# Patient Record
Sex: Female | Born: 1940 | Race: White | Hispanic: No | Marital: Married | Smoking: Former smoker
Health system: Southern US, Community
[De-identification: ages and names within clinical notes are randomized; demographics above are authoritative.]

## PROBLEM LIST (undated history)

## (undated) DIAGNOSIS — I10 Essential (primary) hypertension: Secondary | ICD-10-CM

## (undated) DIAGNOSIS — C801 Malignant (primary) neoplasm, unspecified: Secondary | ICD-10-CM

## (undated) DIAGNOSIS — M1990 Unspecified osteoarthritis, unspecified site: Secondary | ICD-10-CM

## (undated) HISTORY — PX: BRAIN SURGERY: SHX531

## (undated) HISTORY — PX: SHOULDER SURGERY: SHX246

## (undated) HISTORY — PX: APPENDECTOMY: SHX54

---

## 2008-02-18 HISTORY — PX: BACK SURGERY: SHX140

## 2010-02-17 HISTORY — PX: BLADDER SURGERY: SHX569

## 2012-02-18 HISTORY — PX: HEMATOMA EVACUATION: SHX5118

## 2013-06-01 ENCOUNTER — Other Ambulatory Visit: Payer: Self-pay | Admitting: Neurosurgery

## 2013-06-01 ENCOUNTER — Encounter (HOSPITAL_COMMUNITY): Payer: Self-pay | Admitting: Pharmacy Technician

## 2013-06-07 NOTE — Pre-Procedure Instructions (Addendum)
Sheila Rodgers  06/07/2013   Your procedure is scheduled on: 06-13-2013  Monday     Report to [facility]  2 * 3 at 5:30 AM.   Call this number if you have problems the morning of surgery: [phone]   Remember:   Do not eat food or drink liquids after midnight.    Take these medicines the morning of surgery with A SIP OF WATER: pain medication if needed                Take all meds as ordered  Until day of surgery except as instructed below or per dr         Sheila Rodgers all herbel meds, nsaids (aleve,naproxen,advil,ibuprofen) now including vitamins, aspirin, glucosamine                                                                                                    Do not wear jewelry, make-up or nail polish.  Do not wear lotions, powders, or perfumes.   Do not shave 48 hours prior to surgery.   Do not bring valuables to the hospital.  [facility] is not responsible for any belongings or valuables.               Contacts, dentures or bridgework may not be worn into surgery.   Leave suitcase in the car. After surgery it may be brought to your room.   For patients admitted to the hospital, discharge time is determined by your  treatment team.               Patients discharged the day of surgery will not be allowed to drive home.     Special Instructions:  Special Instructions: [facility] - Preparing for Surgery  Before surgery, you can play an important role.  Because skin is not sterile, your skin needs to be as free of germs as possible.  You can reduce the number of germs on you skin by washing with CHG (chlorahexidine gluconate) soap before surgery.  CHG is an antiseptic cleaner which kills germs and bonds with the skin to continue killing germs even after washing.  Please DO NOT use if you have an allergy to CHG or antibacterial soaps.  If your skin becomes reddened/irritated stop using the CHG and inform your nurse when you arrive at [facility].  Do not shave (including legs and underarms) for at least 48 hours prior to the first CHG shower.  You may shave your face.  Please follow these instructions carefully:   1.  Shower with CHG Soap the night before surgery and the morning of Surgery.  2.  If you choose to wash your hair, wash your hair first as usual with your normal shampoo.  3.  After you shampoo, rinse your hair and body thoroughly to remove the Shampoo.  4.  Use CHG as you would any other liquid soap.  You can apply chg directly  to the skin and wash gently with scrungie or a clean washcloth.  5.  Apply the CHG  Soap to your body ONLY FROM THE NECK DOWN.  Do not use on open wounds or open sores.  Avoid contact with your eyes ears, mouth and genitals (private parts).  Wash genitals (private parts)       with your normal soap.  6.  Wash thoroughly, paying special attention to the area where your surgery will be performed.  7.  Thoroughly rinse your body with warm water from the neck down.  8.  DO NOT shower/wash with your normal soap after using and rinsing off the CHG Soap.  9.  Pat yourself dry with a clean towel.            10.  Wear clean pajamas.            11.  Place clean sheets on your bed the night of your first shower and do not sleep with pets.  Day of Surgery  Do not apply any lotions/deodorants the morning of surgery.  Please wear clean clothes to the hospital/surgery center.  Please read over the following fact sheets that you were given: Pain Booklet, Coughing and Deep Breathing and Surgical Site Infection Prevention

## 2013-06-08 ENCOUNTER — Encounter (HOSPITAL_COMMUNITY): Payer: Self-pay

## 2013-06-08 ENCOUNTER — Encounter (HOSPITAL_COMMUNITY)
Admission: RE | Admit: 2013-06-08 | Discharge: 2013-06-08 | Disposition: A | Discharge status: Home / Self Care | Payer: Medicare Other | Source: Ambulatory Visit | Attending: Anesthesiology | Admitting: Anesthesiology

## 2013-06-08 ENCOUNTER — Encounter (HOSPITAL_COMMUNITY)
Admission: RE | Admit: 2013-06-08 | Discharge: 2013-06-08 | Disposition: A | Discharge status: Home / Self Care | Payer: Medicare Other | Source: Ambulatory Visit | Attending: Neurosurgery | Admitting: Neurosurgery

## 2013-06-08 DIAGNOSIS — Z01812 Encounter for preprocedural laboratory examination: Secondary | ICD-10-CM | POA: Insufficient documentation

## 2013-06-08 DIAGNOSIS — Z01810 Encounter for preprocedural cardiovascular examination: Secondary | ICD-10-CM | POA: Insufficient documentation

## 2013-06-08 DIAGNOSIS — Z01818 Encounter for other preprocedural examination: Secondary | ICD-10-CM | POA: Insufficient documentation

## 2013-06-08 HISTORY — DX: Unspecified osteoarthritis, unspecified site: M19.90

## 2013-06-08 HISTORY — DX: Malignant (primary) neoplasm, unspecified: C80.1

## 2013-06-08 HISTORY — DX: Essential (primary) hypertension: I10

## 2013-06-08 LAB — CBC WITH DIFFERENTIAL/PLATELET
BASOS ABS: 0 10*3/uL (ref 0.0–0.1)
Basophils Relative: 0 % (ref 0–1)
EOS ABS: 0.2 10*3/uL (ref 0.0–0.7)
EOS PCT: 4 % (ref 0–5)
HCT: 36 % (ref 36.0–46.0)
Hemoglobin: 12.1 g/dL (ref 12.0–15.0)
LYMPHS ABS: 1.1 10*3/uL (ref 0.7–4.0)
Lymphocytes Relative: 25 % (ref 12–46)
MCH: 32.1 pg (ref 26.0–34.0)
MCHC: 33.6 g/dL (ref 30.0–36.0)
MCV: 95.5 fL (ref 78.0–100.0)
Monocytes Absolute: 0.4 10*3/uL (ref 0.1–1.0)
Monocytes Relative: 8 % (ref 3–12)
Neutro Abs: 2.8 10*3/uL (ref 1.7–7.7)
Neutrophils Relative %: 63 % (ref 43–77)
Platelets: 147 10*3/uL — ABNORMAL LOW (ref 150–400)
RBC: 3.77 MIL/uL — ABNORMAL LOW (ref 3.87–5.11)
RDW: 13.2 % (ref 11.5–15.5)
WBC: 4.5 10*3/uL (ref 4.0–10.5)

## 2013-06-08 LAB — BASIC METABOLIC PANEL
BUN: 21 mg/dL (ref 6–23)
CO2: 25 meq/L (ref 19–32)
CREATININE: 0.81 mg/dL (ref 0.50–1.10)
Calcium: 9 mg/dL (ref 8.4–10.5)
Chloride: 104 mEq/L (ref 96–112)
GFR calc Af Amer: 81 mL/min — ABNORMAL LOW (ref >90)
GFR calc non Af Amer: 70 mL/min — ABNORMAL LOW (ref >90)
Glucose, Bld: 82 mg/dL (ref 70–99)
Potassium: 5 mEq/L (ref 3.7–5.3)
Sodium: 141 mEq/L (ref 137–147)

## 2013-06-08 LAB — SURGICAL PCR SCREEN
MRSA, PCR: NEGATIVE
Staphylococcus aureus: POSITIVE — AB

## 2013-06-08 NOTE — Progress Notes (Signed)
Pt called stating that she had "failed" the nose swab test and that the nurse who called her had told her the rx had been called into [facility]. She states she had called them several times and they have not received the order for the Mupirocin. I called and spoke with the pharmacist and they stated they did not have the order. I gave them the order for the Mupirocin and then called the pt back and told her to check with [facility] in an hour to see if it is ready.

## 2013-06-12 MED ORDER — CEFAZOLIN SODIUM-DEXTROSE 2-3 GM-% IV SOLR
2.0000 g | INTRAVENOUS | Status: AC
  Administered 2013-06-13: 2 g via INTRAVENOUS
  Filled 2013-06-12: qty 50

## 2013-06-13 ENCOUNTER — Inpatient Hospital Stay (HOSPITAL_COMMUNITY): Payer: Medicare Other | Admitting: Anesthesiology

## 2013-06-13 ENCOUNTER — Encounter (HOSPITAL_COMMUNITY): Payer: Medicare Other | Admitting: Anesthesiology

## 2013-06-13 ENCOUNTER — Inpatient Hospital Stay (HOSPITAL_COMMUNITY): Payer: Medicare Other

## 2013-06-13 ENCOUNTER — Encounter (HOSPITAL_COMMUNITY): Payer: Self-pay | Admitting: *Deleted

## 2013-06-13 ENCOUNTER — Encounter (HOSPITAL_COMMUNITY)
Admission: RE | Disposition: A | Discharge status: Home / Self Care | Payer: Self-pay | Source: Ambulatory Visit | Attending: Neurosurgery

## 2013-06-13 ENCOUNTER — Inpatient Hospital Stay (HOSPITAL_COMMUNITY)
Admission: RE | Admit: 2013-06-13 | Discharge: 2013-06-14 | DRG: 473 | Disposition: A | Discharge status: Home / Self Care | Payer: Medicare Other | Source: Ambulatory Visit | Attending: Neurosurgery | Admitting: Neurosurgery

## 2013-06-13 DIAGNOSIS — Z87891 Personal history of nicotine dependence: Secondary | ICD-10-CM

## 2013-06-13 DIAGNOSIS — I10 Essential (primary) hypertension: Secondary | ICD-10-CM | POA: Diagnosis present

## 2013-06-13 DIAGNOSIS — M4712 Other spondylosis with myelopathy, cervical region: Principal | ICD-10-CM | POA: Diagnosis present

## 2013-06-13 DIAGNOSIS — Z8551 Personal history of malignant neoplasm of bladder: Secondary | ICD-10-CM

## 2013-06-13 DIAGNOSIS — M129 Arthropathy, unspecified: Secondary | ICD-10-CM | POA: Diagnosis present

## 2013-06-13 HISTORY — PX: ANTERIOR CERVICAL DECOMPRESSION/DISCECTOMY FUSION 4 LEVELS: SHX5556

## 2013-06-13 SURGERY — ANTERIOR CERVICAL DECOMPRESSION/DISCECTOMY FUSION 4 LEVELS
Anesthesia: General

## 2013-06-13 MED ORDER — HYDROCODONE-ACETAMINOPHEN 5-325 MG PO TABS
1.0000 | ORAL_TABLET | ORAL | Status: DC | PRN
  Administered 2013-06-13 – 2013-06-14 (×2): 2 via ORAL
  Filled 2013-06-13 (×2): qty 2

## 2013-06-13 MED ORDER — ACETAMINOPHEN 650 MG RE SUPP: 650.0000 mg | RECTAL | Status: DC | PRN

## 2013-06-13 MED ORDER — OXYCODONE-ACETAMINOPHEN 5-325 MG PO TABS: 1.0000 | ORAL_TABLET | ORAL | Status: DC | PRN

## 2013-06-13 MED ORDER — CEFAZOLIN SODIUM 1-5 GM-% IV SOLN
1.0000 g | Freq: Three times a day (TID) | INTRAVENOUS | Status: AC
  Administered 2013-06-13 (×2): 1 g via INTRAVENOUS
  Filled 2013-06-13 (×2): qty 50

## 2013-06-13 MED ORDER — SODIUM CHLORIDE 0.9 % IV SOLN
250.0000 mL | INTRAVENOUS | Status: DC
Start: 2013-06-13 — End: 2013-06-14

## 2013-06-13 MED ORDER — SIMVASTATIN 20 MG PO TABS
20.0000 mg | ORAL_TABLET | Freq: Every day | ORAL | Status: DC
  Administered 2013-06-13 – 2013-06-14 (×2): 20 mg via ORAL
  Filled 2013-06-13 (×2): qty 1

## 2013-06-13 MED ORDER — MENTHOL 3 MG MT LOZG
1.0000 | LOZENGE | OROMUCOSAL | Status: DC | PRN
  Filled 2013-06-13: qty 9

## 2013-06-13 MED ORDER — SODIUM CHLORIDE 0.9 % IR SOLN
Status: DC | PRN
  Administered 2013-06-13: 08:00:00

## 2013-06-13 MED ORDER — FENTANYL CITRATE 0.05 MG/ML IJ SOLN
INTRAMUSCULAR | Status: DC | PRN
  Administered 2013-06-13 (×3): 50 ug via INTRAVENOUS
  Administered 2013-06-13: 100 ug via INTRAVENOUS

## 2013-06-13 MED ORDER — HYDROMORPHONE HCL PF 1 MG/ML IJ SOLN
INTRAMUSCULAR | Status: AC
  Filled 2013-06-13: qty 1

## 2013-06-13 MED ORDER — LIDOCAINE HCL (CARDIAC) 20 MG/ML IV SOLN
INTRAVENOUS | Status: DC | PRN
  Administered 2013-06-13: 40 mg via INTRAVENOUS

## 2013-06-13 MED ORDER — PHENYLEPHRINE HCL 10 MG/ML IJ SOLN
INTRAMUSCULAR | Status: DC | PRN
  Administered 2013-06-13: 120 ug via INTRAVENOUS

## 2013-06-13 MED ORDER — LACTATED RINGERS IV SOLN
INTRAVENOUS | Status: DC | PRN
  Administered 2013-06-13: 07:00:00 via INTRAVENOUS

## 2013-06-13 MED ORDER — ROCURONIUM BROMIDE 100 MG/10ML IV SOLN
INTRAVENOUS | Status: DC | PRN
  Administered 2013-06-13: 50 mg via INTRAVENOUS
  Administered 2013-06-13: 10 mg via INTRAVENOUS

## 2013-06-13 MED ORDER — 0.9 % SODIUM CHLORIDE (POUR BTL) OPTIME
TOPICAL | Status: DC | PRN
  Administered 2013-06-13: 1000 mL

## 2013-06-13 MED ORDER — NEOSTIGMINE METHYLSULFATE 1 MG/ML IJ SOLN
INTRAMUSCULAR | Status: DC | PRN
  Administered 2013-06-13: 2.5 mg via INTRAVENOUS

## 2013-06-13 MED ORDER — OXYCODONE-ACETAMINOPHEN 10-325 MG PO TABS: 1.0000 | ORAL_TABLET | ORAL | Status: DC | PRN

## 2013-06-13 MED ORDER — OXYCODONE HCL 5 MG PO TABS
5.0000 mg | ORAL_TABLET | Freq: Once | ORAL | Status: AC | PRN
  Administered 2013-06-13: 5 mg via ORAL

## 2013-06-13 MED ORDER — OXYCODONE HCL 5 MG/5ML PO SOLN: 5.0000 mg | Freq: Once | ORAL | Status: AC | PRN

## 2013-06-13 MED ORDER — PHENYLEPHRINE 40 MCG/ML (10ML) SYRINGE FOR IV PUSH (FOR BLOOD PRESSURE SUPPORT)
PREFILLED_SYRINGE | INTRAVENOUS | Status: AC
  Filled 2013-06-13: qty 10

## 2013-06-13 MED ORDER — METOCLOPRAMIDE HCL 5 MG/ML IJ SOLN
INTRAMUSCULAR | Status: DC | PRN
  Administered 2013-06-13: 10 mg via INTRAVENOUS

## 2013-06-13 MED ORDER — ALBUMIN HUMAN 5 % IV SOLN
INTRAVENOUS | Status: DC | PRN
  Administered 2013-06-13: 10:00:00 via INTRAVENOUS

## 2013-06-13 MED ORDER — CYCLOBENZAPRINE HCL 10 MG PO TABS
10.0000 mg | ORAL_TABLET | Freq: Three times a day (TID) | ORAL | Status: DC | PRN
  Administered 2013-06-13 – 2013-06-14 (×3): 10 mg via ORAL
  Filled 2013-06-13 (×2): qty 1

## 2013-06-13 MED ORDER — SODIUM CHLORIDE 0.9 % IJ SOLN: 3.0000 mL | INTRAMUSCULAR | Status: DC | PRN

## 2013-06-13 MED ORDER — SODIUM CHLORIDE 0.9 % IV SOLN
10.0000 mg | INTRAVENOUS | Status: DC | PRN
  Administered 2013-06-13: 10 ug/min via INTRAVENOUS

## 2013-06-13 MED ORDER — OXYCODONE HCL 5 MG PO TABS: 5.0000 mg | ORAL_TABLET | ORAL | Status: DC | PRN

## 2013-06-13 MED ORDER — OXYCODONE HCL 5 MG PO TABS
5.0000 mg | ORAL_TABLET | ORAL | Status: DC | PRN
  Administered 2013-06-13 – 2013-06-14 (×4): 5 mg via ORAL
  Filled 2013-06-13 (×4): qty 1

## 2013-06-13 MED ORDER — ACETAMINOPHEN 325 MG PO TABS: 650.0000 mg | ORAL_TABLET | ORAL | Status: DC | PRN

## 2013-06-13 MED ORDER — LIDOCAINE HCL (CARDIAC) 20 MG/ML IV SOLN
INTRAVENOUS | Status: AC
  Filled 2013-06-13: qty 5

## 2013-06-13 MED ORDER — LISINOPRIL 10 MG PO TABS
10.0000 mg | ORAL_TABLET | Freq: Every day | ORAL | Status: DC
  Administered 2013-06-13 – 2013-06-14 (×2): 10 mg via ORAL
  Filled 2013-06-13 (×2): qty 1

## 2013-06-13 MED ORDER — OXYCODONE HCL 5 MG PO TABS
ORAL_TABLET | ORAL | Status: AC
  Filled 2013-06-13: qty 1

## 2013-06-13 MED ORDER — SENNA 8.6 MG PO TABS
1.0000 | ORAL_TABLET | Freq: Two times a day (BID) | ORAL | Status: DC
  Administered 2013-06-13 – 2013-06-14 (×3): 8.6 mg via ORAL
  Filled 2013-06-13 (×4): qty 1

## 2013-06-13 MED ORDER — THROMBIN 5000 UNITS EX SOLR
OROMUCOSAL | Status: DC | PRN
  Administered 2013-06-13: 10:00:00 via TOPICAL

## 2013-06-13 MED ORDER — PROPOFOL 10 MG/ML IV BOLUS
INTRAVENOUS | Status: DC | PRN
  Administered 2013-06-13: 150 mg via INTRAVENOUS

## 2013-06-13 MED ORDER — OXYCODONE-ACETAMINOPHEN 5-325 MG PO TABS
1.0000 | ORAL_TABLET | ORAL | Status: DC | PRN
  Administered 2013-06-13 – 2013-06-14 (×4): 1 via ORAL
  Filled 2013-06-13 (×4): qty 1

## 2013-06-13 MED ORDER — FENTANYL CITRATE 0.05 MG/ML IJ SOLN
INTRAMUSCULAR | Status: AC
  Filled 2013-06-13: qty 5

## 2013-06-13 MED ORDER — SODIUM CHLORIDE 0.9 % IJ SOLN
INTRAMUSCULAR | Status: AC
  Filled 2013-06-13: qty 10

## 2013-06-13 MED ORDER — ROCURONIUM BROMIDE 50 MG/5ML IV SOLN
INTRAVENOUS | Status: AC
  Filled 2013-06-13: qty 1

## 2013-06-13 MED ORDER — GLYCOPYRROLATE 0.2 MG/ML IJ SOLN
INTRAMUSCULAR | Status: DC | PRN
  Administered 2013-06-13: .5 mg via INTRAVENOUS

## 2013-06-13 MED ORDER — ALUM & MAG HYDROXIDE-SIMETH 200-200-20 MG/5ML PO SUSP: 30.0000 mL | Freq: Four times a day (QID) | ORAL | Status: DC | PRN

## 2013-06-13 MED ORDER — HYDROMORPHONE HCL PF 1 MG/ML IJ SOLN
0.5000 mg | INTRAMUSCULAR | Status: DC | PRN
  Administered 2013-06-13 – 2013-06-14 (×3): 1 mg via INTRAVENOUS
  Filled 2013-06-13 (×3): qty 1

## 2013-06-13 MED ORDER — EPHEDRINE SULFATE 50 MG/ML IJ SOLN
INTRAMUSCULAR | Status: AC
  Filled 2013-06-13: qty 1

## 2013-06-13 MED ORDER — GELATIN ABSORBABLE 100 EX MISC
CUTANEOUS | Status: DC | PRN
  Administered 2013-06-13: 08:00:00 via TOPICAL

## 2013-06-13 MED ORDER — HYDROMORPHONE HCL PF 1 MG/ML IJ SOLN
0.2500 mg | INTRAMUSCULAR | Status: DC | PRN
  Administered 2013-06-13 (×3): 0.5 mg via INTRAVENOUS

## 2013-06-13 MED ORDER — DEXAMETHASONE SODIUM PHOSPHATE 10 MG/ML IJ SOLN
INTRAMUSCULAR | Status: AC
  Filled 2013-06-13: qty 1

## 2013-06-13 MED ORDER — PROPOFOL 10 MG/ML IV BOLUS
INTRAVENOUS | Status: AC
  Filled 2013-06-13: qty 20

## 2013-06-13 MED ORDER — ONDANSETRON HCL 4 MG/2ML IJ SOLN
INTRAMUSCULAR | Status: DC | PRN
  Administered 2013-06-13: 4 mg via INTRAVENOUS

## 2013-06-13 MED ORDER — METOCLOPRAMIDE HCL 5 MG/ML IJ SOLN
INTRAMUSCULAR | Status: AC
  Filled 2013-06-13: qty 2

## 2013-06-13 MED ORDER — METOCLOPRAMIDE HCL 5 MG/ML IJ SOLN: 10.0000 mg | Freq: Once | INTRAMUSCULAR | Status: DC | PRN

## 2013-06-13 MED ORDER — ONDANSETRON HCL 4 MG/2ML IJ SOLN
4.0000 mg | INTRAMUSCULAR | Status: DC | PRN
Start: 2013-06-13 — End: 2013-06-14

## 2013-06-13 MED ORDER — PHENOL 1.4 % MT LIQD
1.0000 | OROMUCOSAL | Status: DC | PRN
  Administered 2013-06-13: 1 via OROMUCOSAL
  Filled 2013-06-13: qty 177

## 2013-06-13 MED ORDER — SODIUM CHLORIDE 0.9 % IJ SOLN
3.0000 mL | Freq: Two times a day (BID) | INTRAMUSCULAR | Status: DC
  Administered 2013-06-13: 3 mL via INTRAVENOUS

## 2013-06-13 MED ORDER — ONDANSETRON HCL 4 MG/2ML IJ SOLN
INTRAMUSCULAR | Status: AC
  Filled 2013-06-13: qty 2

## 2013-06-13 MED ORDER — DEXAMETHASONE SODIUM PHOSPHATE 10 MG/ML IJ SOLN
10.0000 mg | INTRAMUSCULAR | Status: AC
Start: 2013-06-13 — End: 2013-06-13
  Administered 2013-06-13: 10 mg via INTRAVENOUS

## 2013-06-13 MED ORDER — CYCLOBENZAPRINE HCL 10 MG PO TABS
ORAL_TABLET | ORAL | Status: AC
  Filled 2013-06-13: qty 1

## 2013-06-13 SURGICAL SUPPLY — 68 items
BAG DECANTER FOR FLEXI CONT (MISCELLANEOUS) ×3 IMPLANT
BENZOIN TINCTURE PRP APPL 2/3 (GAUZE/BANDAGES/DRESSINGS) ×3 IMPLANT
BLADE 10 SAFETY STRL DISP (BLADE) ×6 IMPLANT
BRUSH SCRUB EZ PLAIN DRY (MISCELLANEOUS) ×3 IMPLANT
BUR MATCHSTICK NEURO 3.0 LAGG (BURR) ×3 IMPLANT
CAGE PEEK 5X14X11 (Cage) ×3 IMPLANT
CAGE PEEK 6X14X11 (Cage) ×4 IMPLANT
CAGE PEEK 7X14X11 (Cage) ×2 IMPLANT
CAGE SPNL 11X14X6XRADOPQ (Cage) ×2 IMPLANT
CANISTER SUCT 3000ML (MISCELLANEOUS) ×3 IMPLANT
CLOSURE WOUND 1/2 X4 (GAUZE/BANDAGES/DRESSINGS) ×1
CONT SPEC 4OZ CLIKSEAL STRL BL (MISCELLANEOUS) ×3 IMPLANT
DRAPE C-ARM 42X72 X-RAY (DRAPES) ×6 IMPLANT
DRAPE LAPAROTOMY 100X72 PEDS (DRAPES) ×3 IMPLANT
DRAPE MICROSCOPE ZEISS OPMI (DRAPES) ×3 IMPLANT
DRAPE POUCH INSTRU U-SHP 10X18 (DRAPES) ×3 IMPLANT
DRILL BIT (BIT) ×3 IMPLANT
DURAPREP 6ML APPLICATOR 50/CS (WOUND CARE) ×3 IMPLANT
ELECT COATED BLADE 2.86 ST (ELECTRODE) ×3 IMPLANT
ELECT REM PT RETURN 9FT ADLT (ELECTROSURGICAL) ×3
ELECTRODE REM PT RTRN 9FT ADLT (ELECTROSURGICAL) ×1 IMPLANT
GAUZE SPONGE 4X4 16PLY XRAY LF (GAUZE/BANDAGES/DRESSINGS) IMPLANT
GLOVE BIO SURGEON STRL SZ8 (GLOVE) ×3 IMPLANT
GLOVE BIOGEL PI IND STRL 7.0 (GLOVE) ×2 IMPLANT
GLOVE BIOGEL PI INDICATOR 7.0 (GLOVE) ×4
GLOVE ECLIPSE 8.5 STRL (GLOVE) ×3 IMPLANT
GLOVE ECLIPSE 9.0 STRL (GLOVE) ×6 IMPLANT
GLOVE EXAM NITRILE LRG STRL (GLOVE) IMPLANT
GLOVE EXAM NITRILE MD LF STRL (GLOVE) IMPLANT
GLOVE EXAM NITRILE XL STR (GLOVE) IMPLANT
GLOVE EXAM NITRILE XS STR PU (GLOVE) IMPLANT
GLOVE INDICATOR 8.5 STRL (GLOVE) ×3 IMPLANT
GLOVE SURG SS PI 7.0 STRL IVOR (GLOVE) ×12 IMPLANT
GOWN BRE IMP SLV AUR LG STRL (GOWN DISPOSABLE) IMPLANT
GOWN BRE IMP SLV AUR XL STRL (GOWN DISPOSABLE) IMPLANT
GOWN STRL REIN 2XL LVL4 (GOWN DISPOSABLE) IMPLANT
GOWN STRL REUS W/ TWL LRG LVL3 (GOWN DISPOSABLE) ×1 IMPLANT
GOWN STRL REUS W/ TWL XL LVL3 (GOWN DISPOSABLE) ×3 IMPLANT
GOWN STRL REUS W/TWL LRG LVL3 (GOWN DISPOSABLE) ×2
GOWN STRL REUS W/TWL XL LVL3 (GOWN DISPOSABLE) ×6
HALTER HD/CHIN CERV TRACTION D (MISCELLANEOUS) ×3 IMPLANT
KIT BASIN OR (CUSTOM PROCEDURE TRAY) ×3 IMPLANT
KIT ROOM TURNOVER OR (KITS) ×3 IMPLANT
NEEDLE SPNL 20GX3.5 QUINCKE YW (NEEDLE) ×3 IMPLANT
NS IRRIG 1000ML POUR BTL (IV SOLUTION) ×3 IMPLANT
PACK LAMINECTOMY NEURO (CUSTOM PROCEDURE TRAY) ×3 IMPLANT
PAD ARMBOARD 7.5X6 YLW CONV (MISCELLANEOUS) ×9 IMPLANT
PATTIES SURGICAL 1X1 (DISPOSABLE) ×3 IMPLANT
PLATE 4 75XNS SPNE CVD ANT T (Plate) ×1 IMPLANT
PLATE 4 ATLANTIS TRANS (Plate) ×2 IMPLANT
RUBBERBAND STERILE (MISCELLANEOUS) ×6 IMPLANT
SCREW ST FIX 4 ATL 3120213 (Screw) ×30 IMPLANT
SPACER SPNL 11X14X7XPEEK CVD (Cage) ×1 IMPLANT
SPCR SPNL 11X14X7XPEEK CVD (Cage) ×1 IMPLANT
SPONGE GAUZE 4X4 12PLY (GAUZE/BANDAGES/DRESSINGS) ×3 IMPLANT
SPONGE INTESTINAL PEANUT (DISPOSABLE) ×3 IMPLANT
SPONGE SURGIFOAM ABS GEL SZ50 (HEMOSTASIS) ×3 IMPLANT
STRIP CLOSURE SKIN 1/2X4 (GAUZE/BANDAGES/DRESSINGS) ×2 IMPLANT
SUT PDS AB 5-0 P3 18 (SUTURE) ×3 IMPLANT
SUT VIC AB 3-0 SH 8-18 (SUTURE) ×3 IMPLANT
SYR 20ML ECCENTRIC (SYRINGE) ×3 IMPLANT
TAPE CLOTH 4X10 WHT NS (GAUZE/BANDAGES/DRESSINGS) ×3 IMPLANT
TAPE CLOTH SURG 4X10 WHT LF (GAUZE/BANDAGES/DRESSINGS) ×3 IMPLANT
TOWEL OR 17X24 6PK STRL BLUE (TOWEL DISPOSABLE) ×3 IMPLANT
TOWEL OR 17X26 10 PK STRL BLUE (TOWEL DISPOSABLE) ×3 IMPLANT
TRAP SPECIMEN MUCOUS 40CC (MISCELLANEOUS) ×3 IMPLANT
TRAY FOLEY CATH 14FRSI W/METER (CATHETERS) ×3 IMPLANT
WATER STERILE IRR 1000ML POUR (IV SOLUTION) ×3 IMPLANT

## 2013-06-13 NOTE — Brief Op Note (Signed)
06/13/2013  10:34 AM  PATIENT:  Sheila Rodgers  73 y.o. female  PRE-OPERATIVE DIAGNOSIS:  spondylosis w/myelopathy  POST-OPERATIVE DIAGNOSIS:  spondylosis w/myelopathy  PROCEDURE:  Procedure(s): CERVICAL THREE-FOUR, CERVICAL FOUR-FIVE, CERVICAL FIVE-SIX, CERVICAL SIX-SEVEN ANTERIOR CERVICAL DECOMPRESSION/DISCECTOMY FUSION 4 LEVELS (N/A)  SURGEON:  Surgeon(s) and Role:    * Charlie Pitter, MD - Primary    * Elaina Hoops, MD - Assisting  PHYSICIAN ASSISTANT:   ASSISTANTS:    ANESTHESIA:   general  EBL:  Total I/O In: 250 [IV Piggyback:250] Out: 650 [Urine:150; Blood:500]  BLOOD ADMINISTERED:none  DRAINS: none   LOCAL MEDICATIONS USED:  NONE  SPECIMEN:  No Specimen  DISPOSITION OF SPECIMEN:  N/A  COUNTS:  YES  TOURNIQUET:  * No tourniquets in log *  DICTATION: .Dragon Dictation  PLAN OF CARE: Admit to inpatient   PATIENT DISPOSITION:  PACU - hemodynamically stable.   Delay start of Pharmacological VTE agent (>24hrs) due to surgical blood loss or risk of bleeding: yes

## 2013-06-13 NOTE — Transfer of Care (Signed)
Immediate Anesthesia Transfer of Care Note  Patient: Sheila Rodgers  Procedure(s) Performed: Procedure(s): CERVICAL THREE-FOUR, CERVICAL FOUR-FIVE, CERVICAL FIVE-SIX, CERVICAL SIX-SEVEN ANTERIOR CERVICAL DECOMPRESSION/DISCECTOMY FUSION 4 LEVELS (N/A)  Patient Location: PACU  Anesthesia Type:General  Level of Consciousness: awake, alert  and oriented  Airway & Oxygen Therapy: Patient Spontanous Breathing and Patient connected to face mask oxygen  Post-op Assessment: Report given to PACU RN, Post -op Vital signs reviewed and stable and Patient moving all extremities X 4  Post vital signs: Reviewed and stable  Complications: No apparent anesthesia complications

## 2013-06-13 NOTE — Anesthesia Postprocedure Evaluation (Signed)
Anesthesia Post Note  Patient: Sheila Rodgers  Procedure(s) Performed: Procedure(s) (LRB): CERVICAL THREE-FOUR, CERVICAL FOUR-FIVE, CERVICAL FIVE-SIX, CERVICAL SIX-SEVEN ANTERIOR CERVICAL DECOMPRESSION/DISCECTOMY FUSION 4 LEVELS (N/A)  Anesthesia type: General  Patient location: PACU  Post pain: Pain level controlled  Post assessment: Patient's Cardiovascular Status Stable  Last Vitals:  Filed Vitals:   06/13/13 1130  BP:   Pulse: 97  Temp: 36.1 C  Resp: 16    Post vital signs: Reviewed and stable  Level of consciousness: alert  Complications: No apparent anesthesia complications

## 2013-06-13 NOTE — Anesthesia Procedure Notes (Signed)
Procedure Name: Intubation Date/Time: 06/13/2013 7:50 AM Performed by: Kyung Rudd Pre-anesthesia Checklist: Patient identified, Emergency Drugs available, Suction available, Patient being monitored and Timeout performed Patient Re-evaluated:Patient Re-evaluated prior to inductionOxygen Delivery Method: Circle system utilized Preoxygenation: Pre-oxygenation with 100% oxygen Intubation Type: IV induction Ventilation: Mask ventilation without difficulty Laryngoscope Size: Mac and 3 Grade View: Grade I Tube type: Oral Tube size: 7.0 mm Number of attempts: 1 Airway Equipment and Method: Stylet Placement Confirmation: ETT inserted through vocal cords under direct vision,  positive ETCO2 and breath sounds checked- equal and bilateral Secured at: 21 cm Tube secured with: Tape Dental Injury: Teeth and Oropharynx as per pre-operative assessment

## 2013-06-13 NOTE — H&P (Signed)
  Sheila Rodgers is an 73 y.o. female.   Chief Complaint: Neck and bilateral arm pain HPI: The patient is a 73 year old female with neck and bilateral upper extremity numbness or seizures weakness and pain as well as bilateral lower extremity gait ataxia. Workup demonstrates evidence to marked multilevel spondylosis with severe stenosis from C3-4 to C6-7. Patient presents now for 4 level anterior cervical decompression infusion  Past Medical History  Diagnosis Date  . Hypertension   . Arthritis   . Cancer     bladder    Past Surgical History  Procedure Laterality Date  . Shoulder surgery Bilateral 2000. 2009    rotator cuff  . Appendectomy    . Back surgery  10  . Bladder surgery  12    bladder  ca  . Hematoma evacuation  14    subdural  . Brain surgery      History reviewed. No pertinent family history. Social History:  reports that she quit smoking about 20 years ago. Her smoking use included Cigarettes. She has a 26 pack-year smoking history. She does not have any smokeless tobacco history on file. She reports that she drinks alcohol. She reports that she does not use illicit drugs.  Allergies: No Known Allergies  Medications Prior to Admission  Medication Sig Dispense Refill  . bisacodyl (DULCOLAX) 5 MG EC tablet Take 5 mg by mouth daily as needed for moderate constipation.      . Glucosamine HCl (GLUCOSAMINE PO) Take by mouth.      Marland Kitchen lisinopril (PRINIVIL,ZESTRIL) 10 MG tablet Take 10 mg by mouth daily.      . Multiple Vitamin (MULTIVITAMIN WITH MINERALS) TABS tablet Take 1 tablet by mouth daily.      Marland Kitchen oxyCODONE-acetaminophen (PERCOCET) 10-325 MG per tablet Take 1 tablet by mouth every 4 (four) hours as needed for pain.      . simvastatin (ZOCOR) 20 MG tablet Take 20 mg by mouth daily.      Marland Kitchen acetaminophen (TYLENOL) 325 MG tablet Take 650 mg by mouth every 6 (six) hours as needed.        No results found for this or any previous visit (from the past 48 hour(s)). No  results found.  A comprehensive review of systems was negative.  Blood pressure 116/58, pulse 64, temperature 98.4 F (36.9 C), temperature source Oral, resp. rate 18, weight 49.442 kg (109 lb), SpO2 98.00%.  She is awake and alert. She is oriented and appropriate. Cranial nerve function is intact. Motor examination reveals some diffuse weakness in both distal M. is. She has some mild spasticity in both lower extremities. She is hyperreflexic throughout. She is Hoffman responses in both hands. Gait is somewhat unsteady and mildly spastic. Examination head ears eyes and there is unremarkable. Chest and abdomen are benign. Extremities are free from injury or deformity. Assessment/Plan Cervical spondylosis with myelopathy. Planned C3-4, C4-5, C5-6, C6-7 anterior cervical decompression and interbody fusion utilizing peek interbody cage, local autograft, anterior plate instrumentation. Risks and benefits have been explained. Patient wishes to proceed.  Cooper Render Fredick Schlosser 06/13/2013, 7:38 AM

## 2013-06-13 NOTE — Progress Notes (Signed)
Orthopedic Tech Progress Note Patient Details:  Lakina Mcintire [facility] 1940/11/09 [id number]  Ortho Devices Type of Ortho Device: Soft collar Ortho Device/Splint Interventions: Application   Theodoro Parma Cammer 06/13/2013, 10:38 AM

## 2013-06-13 NOTE — Anesthesia Preprocedure Evaluation (Addendum)
Anesthesia Evaluation  Patient identified by MRN, date of birth, ID band Patient awake    Reviewed: Allergy & Precautions, H&P , NPO status , Patient's Chart, lab work & pertinent test results, reviewed documented beta blocker date and time   Airway Mallampati: II TM Distance: >3 FB Neck ROM: full    Dental  (+) Partial Upper, Partial Lower   Pulmonary neg pulmonary ROS, former smoker,  breath sounds clear to auscultation        Cardiovascular hypertension, On Medications Rhythm:regular     Neuro/Psych  Neuromuscular disease negative psych ROS   GI/Hepatic negative GI ROS, Neg liver ROS,   Endo/Other  negative endocrine ROS  Renal/GU negative Renal ROS  negative genitourinary   Musculoskeletal   Abdominal   Peds  Hematology negative hematology ROS (+)   Anesthesia Other Findings See surgeon's H&P   Reproductive/Obstetrics negative OB ROS                          Anesthesia Physical Anesthesia Plan  ASA: II  Anesthesia Plan: General   Post-op Pain Management:    Induction: Intravenous  Airway Management Planned: Oral ETT  Additional Equipment:   Intra-op Plan:   Post-operative Plan: Extubation in OR  Informed Consent: I have reviewed the patients History and Physical, chart, labs and discussed the procedure including the risks, benefits and alternatives for the proposed anesthesia with the patient or authorized representative who has indicated his/her understanding and acceptance.   Dental Advisory Given  Plan Discussed with: CRNA and Surgeon  Anesthesia Plan Comments:         Anesthesia Quick Evaluation

## 2013-06-13 NOTE — Op Note (Signed)
Date of procedure: 06/13/2013  Date of dictation: Same  Service: Neurosurgery  Preoperative diagnosis: C3-4, C4-5, C5-6, C6-7 spondylosis with myelopathy  Postoperative diagnosis: Same  Procedure Name: C3-4, C4-5, C5-6, C6-7 anterior cervical discectomy with interbody fusion utilizing peak cage x4, locally harvested autograft, anterior plate instrumentation  Surgeon:Maudy Yonan A.Vartan Kerins, M.D.  Asst. Surgeon: Saintclair Halsted  Anesthesia: General  Indication: Patient is a 73 year old female with neck and bilateral upper extremity pain paresthesias and weakness failing conservative management. Workup demonstrates evidence of severe multilevel spondylosis and stenosis. Patient presents now for 4 level anterior cervical decompression and fusion.  Operative note: After induction of anesthesia, patient positioned supine with Extended and held in place with halter traction. Patient's anterior cervical region was prepped and draped sterilely. Incision made overlying the C5. This carried down sharply to the platysma. This was then divided vertically and dissection proceeded along the medial border of the sternocleidomastoid muscle and carotid sheath. Trachea and esophagus are mobilized and retracted towards the left. Prevertebral fascia stripped off the anterior spinal column. Longus colli muscles elevated bilaterally using electrocautery. Self-retaining retractors placed intraoperative fluoroscopy was used levels were confirmed. The spaces at C3-4, C4-5, C5-6, C6-7 were then incised a 15 blade in a rectangular fashion. Discectomies were performed at all 4 levels utilizing pituitary rongeurs for tobacco progress Kerrison rongeurs the high-speed drill. All elements the discs were removed down to level posterior annulus. Microscope and brought field these at the remainder of the discectomy. Starting first at C6-7 remaining aspects of annulus and osteophytes removed using a high-speed drill down to the level of the posterior  longitudinal ligament. Posterior longitudinal ligament was elevated and resected so fashion and Kerrison rongeurs. Underlying thecal sac was identified. Wide central decompression and perform undercutting the bodies of C6 and C7. Decompression then proceeded out each neural foramen. Wide anterior foraminotomies were performed on course exiting C7 nerve roots bilaterally. At this point a very thorough decompression been achieved. There was no evidence of injury to thecal sac or nerve roots. Procedure then repeated at C5-6 C4-5 and C3-4. Wounds that area they live solution. Gelfoam was placed topically for hemostasis. [employer] anatomic peek cages were then packed with morselized autograft harvested during the discectomy and were then impacted in place and recessed slightly from the anterior cortical margin of the vertebral bodies. After all 4 cages and then inserted and placed a 75 mm Atlantis translational plate was then placed over the C3, C4, C5, C6, C7 levels. This is an attachment or fluoroscopic guidance using 13 mm fixed angle screws 2 each at all 5 levels. All 10 screws given a final tightening be solidly within bone. Locking screws and gauge at all levels. Final images revealed good position the bone graft and hardware at proper upper level with normal lamina spine. Wound is irrigated one final time. Hemostasis was assured with bipolar try repaired was and close in layers. Steri-Strips and sterile dressing were applied. There were no apparent competitions. Patient tolerated the procedure well and she returns to the recovery postop.

## 2013-06-14 MED ORDER — METHOCARBAMOL 500 MG PO TABS: 500.0000 mg | ORAL_TABLET | Freq: Four times a day (QID) | ORAL | Status: AC

## 2013-06-14 MED ORDER — OXYCODONE-ACETAMINOPHEN 10-325 MG PO TABS: 1.0000 | ORAL_TABLET | ORAL | Status: AC | PRN

## 2013-06-14 NOTE — Evaluation (Signed)
Occupational Therapy Evaluation Patient Details Name: Sheila Rodgers MRN: [id number] DOB: October 21, 1940 Today's Date: 06/14/2013    History of Present Illness ACDF C3-7   Clinical Impression   Pt s/p above procedure. Education provided during session and several cues given for precautions.    Follow Up Recommendations  No OT follow up;Supervision/Assistance - 24 hour    Equipment Recommendations  None recommended by OT    Recommendations for Other Services       Precautions / Restrictions Precautions Precautions: Cervical Required Braces or Orthoses: Cervical Brace Cervical Brace: Soft collar;At all times Restrictions Weight Bearing Restrictions: No      Mobility Bed Mobility Overal bed mobility: Needs Assistance Bed Mobility: Rolling;Sidelying to Sit;Sit to Sidelying Rolling: Min guard;Min assist Sidelying to sit: Min guard     Sit to sidelying: Min guard General bed mobility comments: Cues for log roll technique.   Transfers Overall transfer level: Needs assistance Equipment used: None Transfers: Sit to/from Stand Sit to Stand: Supervision         General transfer comment: Reinforced technique     Balance                                            ADL Overall ADL's : Needs assistance/impaired                 Upper Body Dressing : Supervision/safety;Standing   Lower Body Dressing: Minimal assistance;Sit to/from stand   Toilet Transfer: Supervision/safety (bed)           Functional mobility during ADLs: Supervision/safety General ADL Comments: Educated on use of cup and straw to help maintain precautions. Discussed button up shirts. Gave pt/family squeeze ball to use with hands. Educated on precautions. Practiced bed mobility. Recommended spouse be with her for shower transfer. Pt's daughter came in towards end and OT reviewed some information with her.  Several cues for precautions during session.  Educated on AE for LB  ADLs as pt states it hurt when getting dressed (in which pt crossed feet over knees).     Vision                     Perception     Praxis      Pertinent Vitals/Pain At end of session pt rated pain 8/10. Nurse notified and pt premedicated prior to session.      Hand Dominance     Extremity/Trunk Assessment Upper Extremity Assessment Upper Extremity Assessment: RUE deficits/detail;LUE deficits/detail RUE Deficits / Details: pain in both hands; weak grasp LUE Deficits / Details: pain in both hands; weak grasp   Lower Extremity Assessment Lower Extremity Assessment: Defer to PT evaluation   Cervical / Trunk Assessment Cervical / Trunk Assessment: Kyphotic   Communication Communication Communication: No difficulties   Cognition Arousal/Alertness: Awake/alert Behavior During Therapy: WFL for tasks assessed/performed Overall Cognitive Status: Impaired/Different from baseline Area of Impairment: Memory;Attention; safety/judgement    Current Attention Level: Selective Memory: Decreased recall of precautions   Safety/Judgement: Decreased awareness of safety         General Comments       Exercises       Shoulder Instructions      Home Living Family/patient expects to be discharged to:: Private residence Living Arrangements: Spouse/significant other Available Help at Discharge: Family;Available 24 hours/day Type of Home: House Home Access: Stairs to enter  Entrance Stairs-Number of Steps: 2   Home Layout: Two level;Bed/bath upstairs Alternate Level Stairs-Number of Steps: 20 Alternate Level Stairs-Rails: Right Bathroom Shower/Tub: Occupational psychologist: Standard     Home Equipment: Environmental consultant - 2 wheels;Shower seat; reacher          Prior Functioning/Environment Level of Independence: Independent             OT Diagnosis: Acute pain   OT Problem List: Pain;Decreased knowledge of precautions;Decreased knowledge of use of DME or  AE;Decreased cognition;Decreased strength   OT Treatment/Interventions: Self-care/ADL training;Therapeutic exercise;DME and/or AE instruction;Therapeutic activities;Cognitive remediation/compensation;Balance training;Patient/family education    OT Goals(Current goals can be found in the care plan section) Acute Rehab OT Goals Patient Stated Goal: not stated OT Goal Formulation: With patient/family Time For Goal Achievement: 06/21/13 Potential to Achieve Goals: Good  OT Frequency: Min 2X/week   Barriers to D/C:            Co-evaluation              End of Session Equipment Utilized During Treatment: Cervical collar Nurse Communication: Other (comment) (pain)  Activity Tolerance: Patient tolerated treatment well Patient left: Other (comment);with family/visitor present (in room)   Time: 5643-3295 OT Time Calculation (min): 31 min Charges:  OT General Charges $OT Visit: 1 Procedure OT Evaluation $Initial OT Evaluation Tier I: 1 Procedure OT Treatments $Self Care/Home Management : 8-22 mins G-Codes:    Benito Mccreedy OTR/L [phone] 06/14/2013, 11:26 AM

## 2013-06-14 NOTE — Evaluation (Signed)
Physical Therapy Evaluation Patient Details Name: Sheila Rodgers MRN: [id number] DOB: 1940/10/03 Today's Date: 06/14/2013   History of Present Illness  ACDF C3-7  Clinical Impression  Pt with decreased activity and function due to pain in bil UE which she reports as worse than prior to sx. Unable to test upper extremity strength due to pain with touch however ROM appears [facility] for activity. Pt educated for precautions and collar wear but states she will not perform correctly due to pain. Handout given and education provided. Pt will benefit from acute therapy to maximize mobility, safety awareness and adherence to precautions.     Follow Up Recommendations Home health PT;Supervision for mobility/OOB    Equipment Recommendations  None recommended by PT    Recommendations for Other Services OT consult     Precautions / Restrictions Precautions Precautions: Cervical Required Braces or Orthoses: Cervical Brace Cervical Brace: Soft collar;At all times      Mobility  Bed Mobility Overal bed mobility: Needs Assistance Bed Mobility: Rolling;Sidelying to Sit;Sit to Sidelying Rolling: Min assist Sidelying to sit: Min assist     Sit to sidelying: Min assist General bed mobility comments: cueing and assist for sequence as pt reports increased pain with transfer onto her shoulder and required assist and constant cues for sequence and precautions. Pt states she will not perform in this manner at home  Transfers Overall transfer level: Modified independent                  Ambulation/Gait Ambulation/Gait assistance: Supervision Ambulation Distance (Feet): 200 Feet Assistive device: Rolling walker (2 wheeled) Gait Pattern/deviations: Step-through pattern;Decreased stride length   Gait velocity interpretation: Below normal speed for age/gender General Gait Details: cues for posture and position in RW  Stairs Stairs: Yes Stairs assistance: Supervision Stair Management: One  rail Right;Alternating pattern;Forwards Number of Stairs: 18 General stair comments: pt able to grasp rail without difficulty  Wheelchair Mobility    Modified Rankin (Stroke Patients Only)       Balance                                             Pertinent Vitals/Pain 10/10 bil UE pain, RN aware, repositioned    Home Living Family/patient expects to be discharged to:: Private residence Living Arrangements: Spouse/significant other Available Help at Discharge: Family;Available 24 hours/day Type of Home: House Home Access: Stairs to enter   [employer] of Steps: 2 Home Layout: Two level;Bed/bath upstairs Home Equipment: Walker - 2 wheels      Prior Function Level of Independence: Independent               Hand Dominance        Extremity/Trunk Assessment   Upper Extremity Assessment: Defer to OT evaluation (bil UE burning with pain and unable to fully assess)           Lower Extremity Assessment: Overall WFL for tasks assessed      Cervical / Trunk Assessment: Kyphotic  Communication   Communication: No difficulties  Cognition Arousal/Alertness: Awake/alert Behavior During Therapy: WFL for tasks assessed/performed Overall Cognitive Status: Impaired/Different from baseline Area of Impairment: Safety/judgement     Memory: Decreased recall of precautions   Safety/Judgement: Decreased awareness of safety          General Comments      Exercises  Assessment/Plan    PT Assessment Patient needs continued PT services  PT Diagnosis Acute pain   PT Problem List Decreased strength;Decreased activity tolerance;Decreased mobility;Pain;Decreased knowledge of use of DME  PT Treatment Interventions Gait training;DME instruction;Functional mobility training;Therapeutic activities;Patient/family education   PT Goals (Current goals can be found in the Care Plan section) Acute Rehab PT Goals Patient Stated Goal: be  out of pain PT Goal Formulation: With patient Time For Goal Achievement: 06/21/13 Potential to Achieve Goals: Fair    Frequency Min 4X/week   Barriers to discharge        Co-evaluation               End of Session Equipment Utilized During Treatment: Cervical collar Activity Tolerance: Patient limited by pain Patient left: in chair;with call bell/phone within reach Nurse Communication: Mobility status;Precautions         Time: 3785-8850 PT Time Calculation (min): 18 min   Charges:   PT Evaluation $Initial PT Evaluation Tier I: 1 Procedure PT Treatments $Therapeutic Activity: 8-22 mins   PT G Codes:          Jeramey Lanuza B Kemp Gomes 06/14/2013, 8:47 AM Elwyn Reach, [place]

## 2013-06-14 NOTE — Discharge Summary (Signed)
Physician Discharge Summary  Patient ID: Sheila Rodgers MRN: [id number] DOB/AGE: Jun 25, 1940 73 y.o.  Admit date: 06/13/2013 Discharge date: 06/14/2013  Admission Diagnoses:  Discharge Diagnoses:  Principal Problem:   Spondylosis, cervical, with myelopathy Active Problems:   Cervical spondylosis with myelopathy   Discharged Condition: good  Hospital Course: Patient in the hospital where she underwent uncomplicated 4 level anterior cervical decompression and fusion. Postoperative she is doing fairly well. Still having a moderate amount of neck pain and still having some upper extremity symptoms but overall is improved from preop. Patient is up ambulating without difficulty. She is swallowing reasonably well. She feels ready for discharge home.  Consults:   Significant Diagnostic Studies:   Treatments:   Discharge Exam: Blood pressure 128/60, pulse 82, temperature 99.4 F (37.4 C), temperature source Oral, resp. rate 18, weight 49.442 kg (109 lb), SpO2 94.00%. Awake and alert. Oriented and appropriate. Motor and sensory function intact. Wound clean and dry. Chest and abdomen benign.  Disposition: Final discharge disposition not confirmed     Medication List         acetaminophen 325 MG tablet  Commonly known as:  TYLENOL  Take 650 mg by mouth every 6 (six) hours as needed.     bisacodyl 5 MG EC tablet  Commonly known as:  DULCOLAX  Take 5 mg by mouth daily as needed for moderate constipation.     GLUCOSAMINE PO  Take by mouth.     lisinopril 10 MG tablet  Commonly known as:  PRINIVIL,ZESTRIL  Take 10 mg by mouth daily.     methocarbamol 500 MG tablet  Commonly known as:  ROBAXIN  Take 1 tablet (500 mg total) by mouth 4 (four) times daily.     multivitamin with minerals Tabs tablet  Take 1 tablet by mouth daily.     oxyCODONE-acetaminophen 10-325 MG per tablet  Commonly known as:  PERCOCET  Take 1-2 tablets by mouth every 4 (four) hours as needed for pain.      simvastatin 20 MG tablet  Commonly known as:  ZOCOR  Take 20 mg by mouth daily.         Signed: Cooper Render Edilberto Roosevelt 06/14/2013, 9:59 AM

## 2013-06-14 NOTE — Progress Notes (Signed)
Utilization review completed.  

## 2013-06-14 NOTE — Discharge Instructions (Signed)

## 2013-06-14 NOTE — Progress Notes (Signed)
Pt and family given D/C instructions with Rx's, verbal understanding was given. Pt's IV was removed prior to D/C. Pt D/C'd home via wheelchair @ 1110 per MD order. Pt is stable @ D/C and has no other needs. Holli Humbles, RN

## 2013-06-17 ENCOUNTER — Encounter (HOSPITAL_COMMUNITY): Payer: Self-pay | Admitting: Neurosurgery

## 2015-11-12 IMAGING — RF DG C-ARM 61-120 MIN
1 series · 1 of 1 positions shown · non-contrast
Comparison: MR 03/10/2013

CLINICAL DATA: Cervical fusion

EXAM:
DG CERVICAL SPINE - 1 VIEW; DG C-ARM 1-60 MIN

[Series 1: run · 1 of 1 slices shown]
[im 1/1]
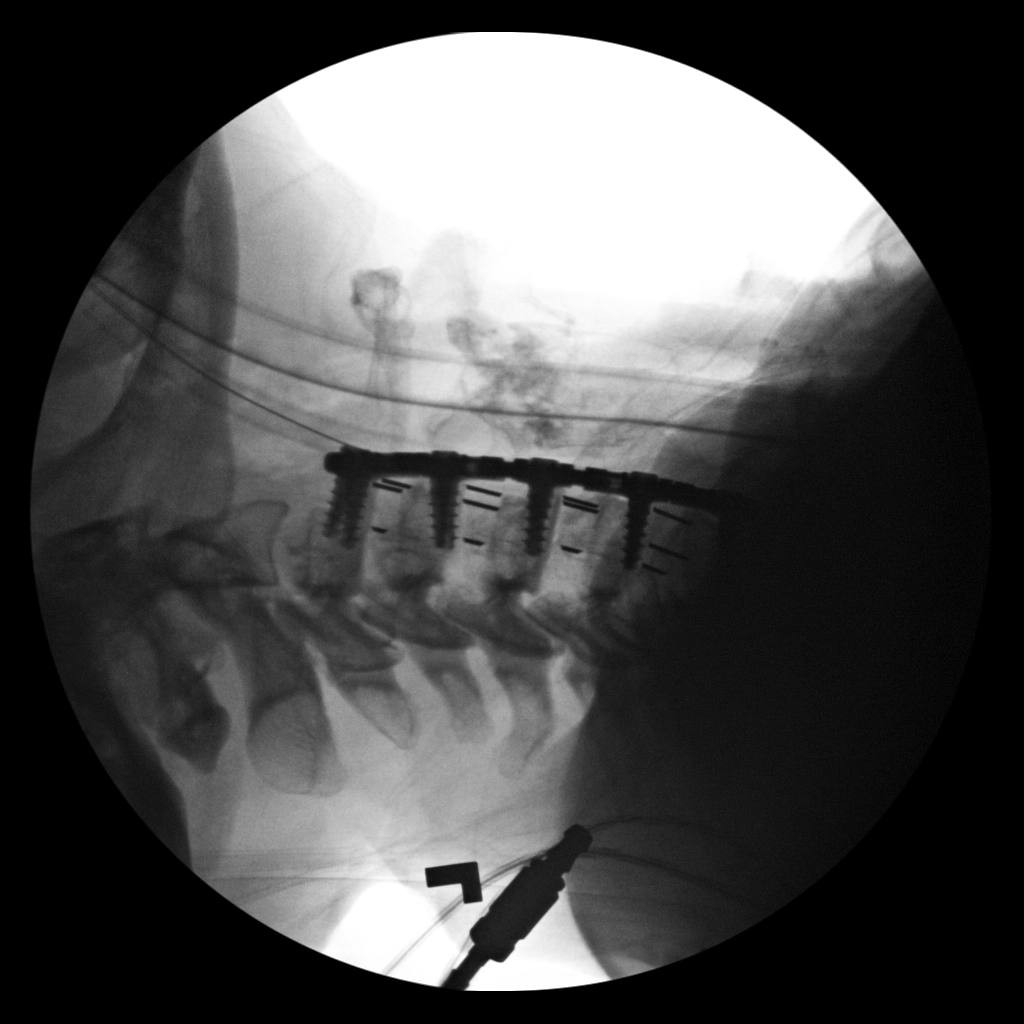

[1 of 1 positions shown; findings below may reference images not displayed]

FINDINGS: Single lateral intraoperative fluoroscopic spot image documents
changes of instrumented ACDF C3-C7. Graft markers at C3-4 are
centered anterior to the C3 vertebral body.
IMPRESSION: ACDF C3-C7
